# Patient Record
Sex: Male | Born: 1965 | Race: White | Marital: Married | Smoking: Never smoker
Health system: Southern US, Community
[De-identification: ages and names within clinical notes are randomized; demographics above are authoritative.]

## PROBLEM LIST (undated history)

## (undated) DIAGNOSIS — E785 Hyperlipidemia, unspecified: Secondary | ICD-10-CM

## (undated) HISTORY — PX: OTHER SURGICAL HISTORY: SHX169

## (undated) HISTORY — DX: Hyperlipidemia, unspecified: E78.5

---

## 2014-10-30 ENCOUNTER — Ambulatory Visit (INDEPENDENT_AMBULATORY_CARE_PROVIDER_SITE_OTHER): Payer: BC Managed Care – PPO | Admitting: Cardiology

## 2014-10-30 ENCOUNTER — Ambulatory Visit (INDEPENDENT_AMBULATORY_CARE_PROVIDER_SITE_OTHER)
Admission: RE | Admit: 2014-10-30 | Discharge: 2014-10-30 | Disposition: A | Discharge status: Home / Self Care | Payer: Self-pay | Source: Ambulatory Visit | Attending: Cardiology | Admitting: Cardiology

## 2014-10-30 ENCOUNTER — Encounter: Payer: Self-pay | Admitting: Cardiology

## 2014-10-30 VITALS — BP 110/72 | HR 75 | Ht 75.0 in | Wt 234.4 lb

## 2014-10-30 DIAGNOSIS — Z8249 Family history of ischemic heart disease and other diseases of the circulatory system: Secondary | ICD-10-CM

## 2014-10-30 DIAGNOSIS — E785 Hyperlipidemia, unspecified: Secondary | ICD-10-CM

## 2014-10-30 NOTE — Patient Instructions (Signed)
**Note De-Identified Neelah Mannings Obfuscation** Your physician recommends that you continue on your current medications as directed. Please refer to the Current Medication list given to you today.  Your physician has ordered a Calcium Score on you, we will arrange.  Your physician recommends that you schedule a follow-up appointment in: as needed

## 2014-10-30 NOTE — Progress Notes (Signed)
[address] [place], [place]  [address] Phone: [phone] Fax:  [phone]) [phone]  Date:  10/30/2014   ID:  Vernon AndreaMark Hyun, DOB [birth year], MRN [id number]  PCP:  No primary care provider on file.   History of Present Illness: Vernon Miller is a 49 y.o. male here for evaluation secondary to family history of coronary artery disease. 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 His father died of myocardial infarction (5650 first MI) at age 47, his brother has coronary artery disease status post PCI at age 47, his other brother was also diagnosed with coronary artery disease. He has never smoked. He does not drink to excess. He is currently on atorvastatin 20 mg. His last LDL was 101. ALT 34.  His mother was also recently diagnosed with cardiomyopathy, ejection fraction of 30%.  His family, wife is concerned about his own cardiac status. He himself is having no chest pain, no shortness of breath, syncope, fevers, chills, orthopnea.     Wt Readings from Last 3 Encounters:  10/30/14 234 lb 6.4 oz (106.323 kg)     Past Medical History  Diagnosis Date  . Hyperlipidemia     Past Surgical History  Procedure Laterality Date  . None      Current Outpatient Prescriptions  Medication Sig Dispense Refill  . atorvastatin (LIPITOR) 20 MG tablet Take 20 mg by mouth daily.     No current facility-administered medications for this visit.    Allergies:   No Known Allergies  Social History:  The patient  reports that he has never smoked. He does not have any smokeless tobacco history on file. He reports that he drinks alcohol. He reports that he does not use illicit drugs.   Family History  Problem Relation Age of Onset  . Heart Problems Mother   . Heart attack Father     ROS:  Please see the history of present illness.   No syncope, no bleeding, no orthopnea, no PND. No rashes.   All other systems reviewed and negative.   PHYSICAL EXAM: VS:  BP 110/72 mmHg  Pulse 75   Ht 6\' 3"  (1.905 m)  Wt 234 lb 6.4 oz (106.323 kg)  BMI 29.30 kg/m2 Well nourished, well developed, in no acute distress HEENT: normal, /AT, EOMI Neck: no JVD, normal carotid upstroke, no bruit Cardiac:  normal S1, S2; RRR; no murmur Lungs:  clear to auscultation bilaterally, no wheezing, rhonchi or rales Abd: soft, nontender, no hepatomegaly, no bruits Ext: no edema, 2+ distal pulses Skin: warm and dry GU: deferred Neuro: no focal abnormalities noted, AAO x 3  EKG:  10/30/14-sinus rhythm, 75, no other abnormalities except for subtle J-point elevation diffusely.  Labs: LDL was 101. ALT 34      ASSESSMENT AND PLAN:  1. Strong family history of coronary artery disease/hyperlipidemia - multiple family members with coronary artery disease. Mother with cardiomyopathy currently EF of 30%. We discussed potential modalities for assessment of his cardiac status and have chosen to proceed with coronary calcium score. For now, continue with atorvastatin 20 mg. If calcium score comes back abnormal, we will increase his atorvastatin to 40 mg. We will also likely proceed if abnormal with nuclear stress test to see if there are any flow limitations underlying. We discussed the rationale behind these testing. Invasive testing with coronary angiography would not be warranted at this time. He understands. 2. We will follow-up as needed.  Signed, Donato SchultzMark Pallie Swigert, MD [facility]  10/30/2014 3:40 PM

## 2014-11-04 ENCOUNTER — Telehealth: Payer: Self-pay | Admitting: Cardiology

## 2014-11-04 NOTE — Telephone Encounter (Signed)
Left message to call back  

## 2014-11-04 NOTE — Telephone Encounter (Signed)
Follow up     Returning a nurses call to get lab results.  Please call back and ok [facility]

## 2014-11-04 NOTE — Telephone Encounter (Signed)
Pt returned call. Informed pt of results. Pt verbalized understanding.

## 2014-11-04 NOTE — Telephone Encounter (Signed)
New Message          Pt calling to find out results of calcium scoring. Please call back and advise.

## 2015-10-28 IMAGING — CT CT HEART SCORING
1 of 3 series · 10 of 20 positions shown, 13 images · non-contrast
Comparison: No priors.

CLINICAL DATA: Risk stratification

EXAM:
Coronary Calcium Score
TECHNIQUE: The patient was scanned on a Siemens Sensation 16 slice scanner.
Axial non-contrast 3mm slices were carried out through the heart.
The data set was analyzed on a dedicated work station and scored
using the Agatson method.

[Series 6: st thins for reformat · axial · 0.76mm/px · z∈[-280,-164]mm · 10 of 142 slices shown, 13 images]
[im 13/142  vessel]
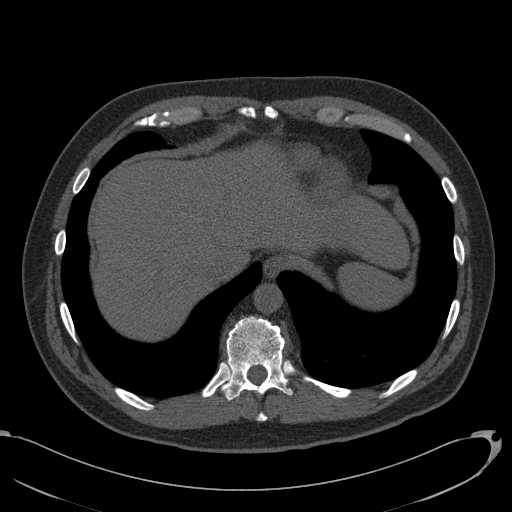
[im 13/142  lung]
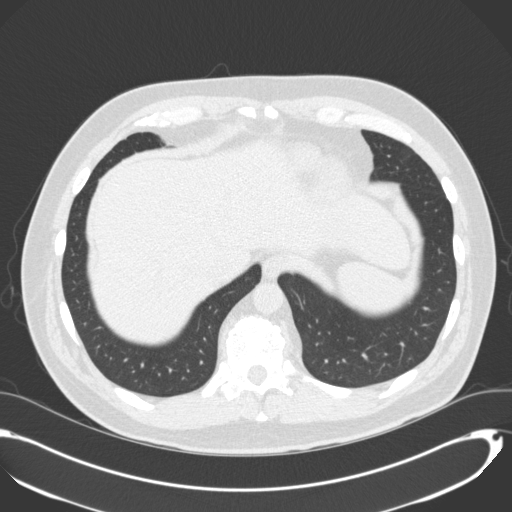
[im 26/142  vessel]
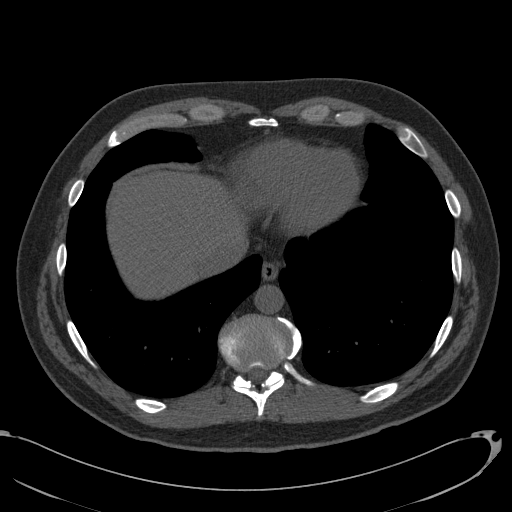
[im 39/142  vessel]
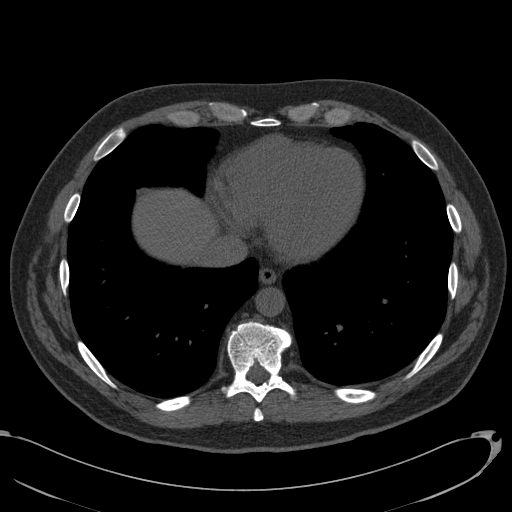
[im 52/142  vessel]
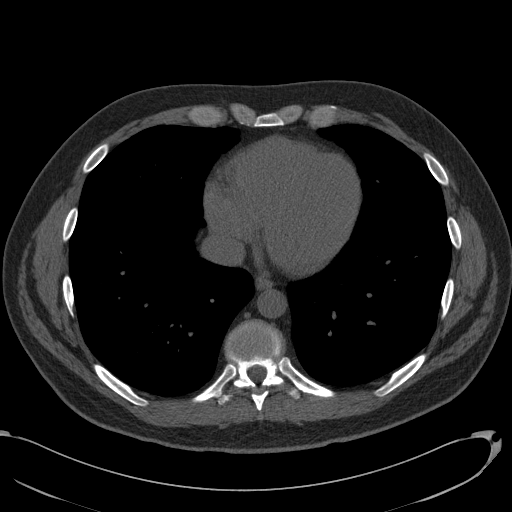
[im 65/142  vessel]
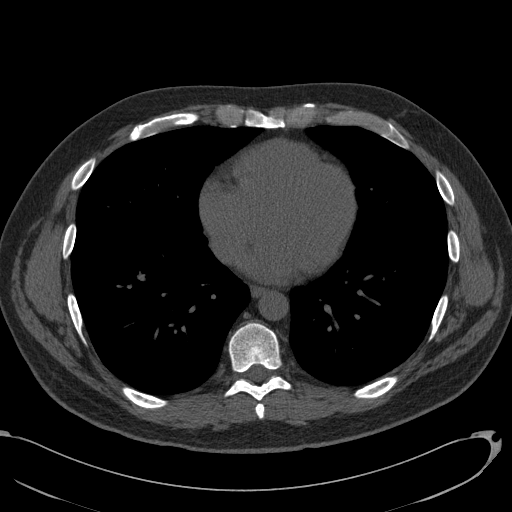
[im 65/142  lung]
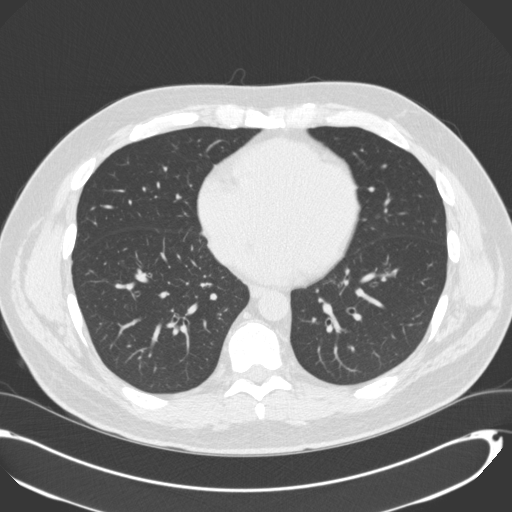
[im 77/142  vessel]
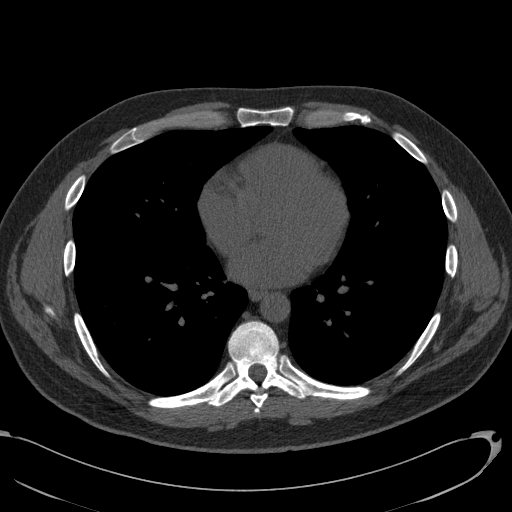
[im 90/142  vessel]
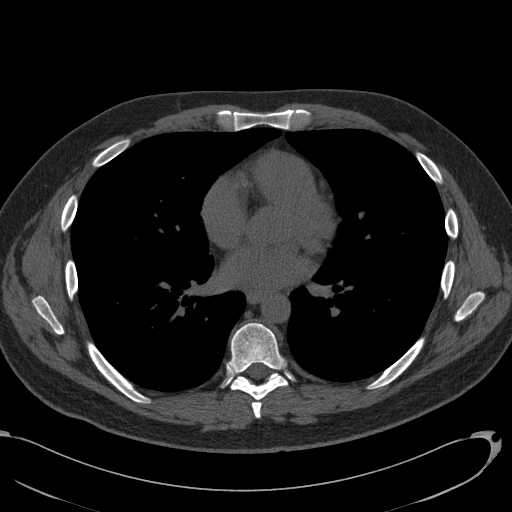
[im 103/142  vessel]
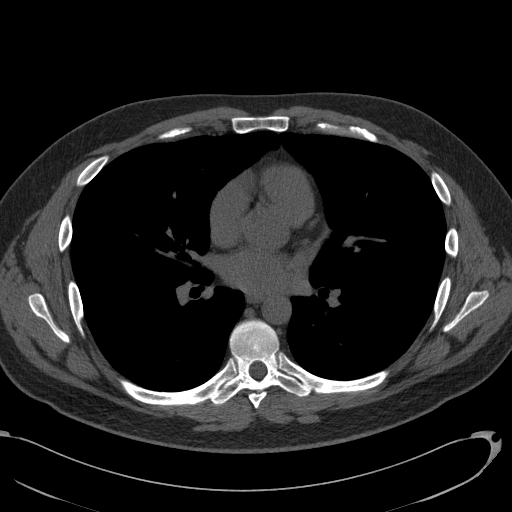
[im 116/142  vessel]
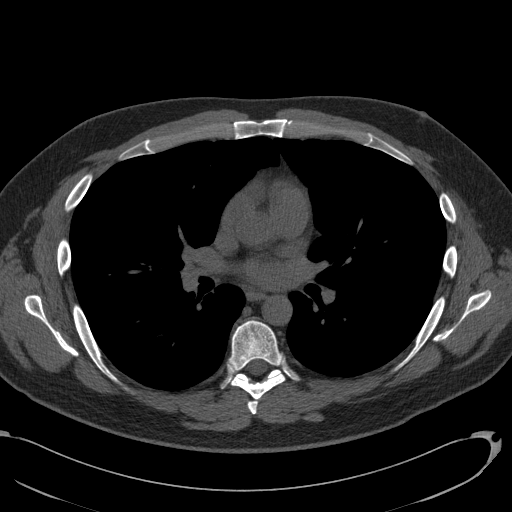
[im 116/142  lung]
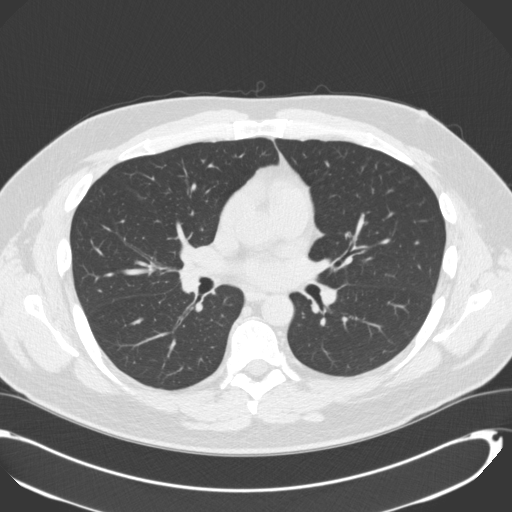
[im 129/142  vessel]
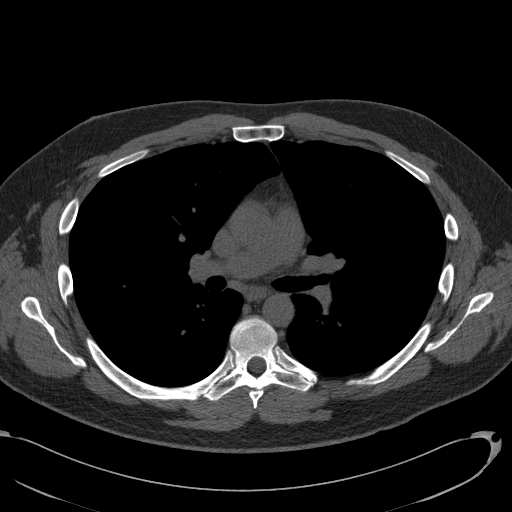

[10 of 20 positions shown; findings below may reference images not displayed]

FINDINGS: Non-cardiac: 3 mm lung nodule See separate report from [REDACTED].

Ascending Aorta:  3.3 cm

Pericardium: Normal

Coronary arteries:  No calcium seen
IMPRESSION: Coronary calcium score of 0.

Baruhuke Devote

EXAM:
OVER-READ INTERPRETATION  CT CHEST

The following report is an over-read performed by radiologist Dr.
over-read does not include interpretation of cardiac or coronary
anatomy or pathology. The coronary calcium score interpretation by
the cardiologist is attached.
FINDINGS: 3 mm nodule in the right middle lobe (image 15 of series 4). No
other larger more suspicious appearing pulmonary nodules or masses
are otherwise noted in the visualized portions of the thorax. Within
the visualized thorax there is no pneumothorax, acute consolidative
airspace disease, pleural effusion or lymphadenopathy. Visualized
portions of the upper abdomen are unremarkable. There are no
aggressive appearing lytic or blastic lesions noted in the
visualized portions of the skeleton.
IMPRESSION: 1. 3 mm nodule in the right middle lobe a statistically very likely
to be benign in this patient, but is nonspecific. If the patient is
at high risk for bronchogenic carcinoma, follow-up chest CT at 1
year is recommended. If the patient is at low risk, no follow-up is
needed. This recommendation follows the consensus statement:
Guidelines for Management of Small Pulmonary Nodules Detected on CT
Scans: A Statement from the [HOSPITAL] as published in

## 2016-06-10 DIAGNOSIS — E7800 Pure hypercholesterolemia, unspecified: Secondary | ICD-10-CM | POA: Diagnosis not present

## 2016-06-10 DIAGNOSIS — Z23 Encounter for immunization: Secondary | ICD-10-CM | POA: Diagnosis not present

## 2016-06-10 DIAGNOSIS — N529 Male erectile dysfunction, unspecified: Secondary | ICD-10-CM | POA: Diagnosis not present

## 2016-06-10 DIAGNOSIS — F322 Major depressive disorder, single episode, severe without psychotic features: Secondary | ICD-10-CM | POA: Diagnosis not present

## 2016-06-10 DIAGNOSIS — Z79899 Other long term (current) drug therapy: Secondary | ICD-10-CM | POA: Diagnosis not present

## 2016-10-19 DIAGNOSIS — K625 Hemorrhage of anus and rectum: Secondary | ICD-10-CM | POA: Diagnosis not present

## 2016-10-19 DIAGNOSIS — E7800 Pure hypercholesterolemia, unspecified: Secondary | ICD-10-CM | POA: Diagnosis not present

## 2016-12-22 DIAGNOSIS — N529 Male erectile dysfunction, unspecified: Secondary | ICD-10-CM | POA: Diagnosis not present

## 2016-12-22 DIAGNOSIS — F322 Major depressive disorder, single episode, severe without psychotic features: Secondary | ICD-10-CM | POA: Diagnosis not present

## 2016-12-22 DIAGNOSIS — E7800 Pure hypercholesterolemia, unspecified: Secondary | ICD-10-CM | POA: Diagnosis not present

## 2016-12-22 DIAGNOSIS — Z79899 Other long term (current) drug therapy: Secondary | ICD-10-CM | POA: Diagnosis not present

## 2017-06-24 DIAGNOSIS — F322 Major depressive disorder, single episode, severe without psychotic features: Secondary | ICD-10-CM | POA: Diagnosis not present

## 2017-06-24 DIAGNOSIS — Z79899 Other long term (current) drug therapy: Secondary | ICD-10-CM | POA: Diagnosis not present

## 2017-06-24 DIAGNOSIS — Z23 Encounter for immunization: Secondary | ICD-10-CM | POA: Diagnosis not present

## 2017-06-24 DIAGNOSIS — E7800 Pure hypercholesterolemia, unspecified: Secondary | ICD-10-CM | POA: Diagnosis not present

## 2017-06-24 DIAGNOSIS — N529 Male erectile dysfunction, unspecified: Secondary | ICD-10-CM | POA: Diagnosis not present

## 2018-01-02 DIAGNOSIS — N529 Male erectile dysfunction, unspecified: Secondary | ICD-10-CM | POA: Diagnosis not present

## 2018-01-02 DIAGNOSIS — R1032 Left lower quadrant pain: Secondary | ICD-10-CM | POA: Diagnosis not present

## 2018-01-02 DIAGNOSIS — E7800 Pure hypercholesterolemia, unspecified: Secondary | ICD-10-CM | POA: Diagnosis not present

## 2018-01-02 DIAGNOSIS — Z79899 Other long term (current) drug therapy: Secondary | ICD-10-CM | POA: Diagnosis not present

## 2018-01-02 DIAGNOSIS — F322 Major depressive disorder, single episode, severe without psychotic features: Secondary | ICD-10-CM | POA: Diagnosis not present

## 2018-01-04 ENCOUNTER — Other Ambulatory Visit: Payer: Self-pay | Admitting: Family Medicine

## 2018-01-04 DIAGNOSIS — R1032 Left lower quadrant pain: Secondary | ICD-10-CM

## 2018-01-09 ENCOUNTER — Ambulatory Visit
Admission: RE | Admit: 2018-01-09 | Discharge: 2018-01-09 | Disposition: A | Discharge status: Home / Self Care | Payer: BLUE CROSS/BLUE SHIELD | Source: Ambulatory Visit | Attending: Family Medicine | Admitting: Family Medicine

## 2018-01-09 DIAGNOSIS — R1032 Left lower quadrant pain: Secondary | ICD-10-CM | POA: Diagnosis not present

## 2018-01-09 MED ORDER — IOPAMIDOL (ISOVUE-300) INJECTION 61%
125.0000 mL | Freq: Once | INTRAVENOUS | Status: AC | PRN
  Administered 2018-01-09: 125 mL via INTRAVENOUS

## 2018-07-04 DIAGNOSIS — N529 Male erectile dysfunction, unspecified: Secondary | ICD-10-CM | POA: Diagnosis not present

## 2018-07-04 DIAGNOSIS — F5221 Male erectile disorder: Secondary | ICD-10-CM | POA: Diagnosis not present

## 2018-07-04 DIAGNOSIS — Z23 Encounter for immunization: Secondary | ICD-10-CM | POA: Diagnosis not present

## 2018-07-04 DIAGNOSIS — Z0000 Encounter for general adult medical examination without abnormal findings: Secondary | ICD-10-CM | POA: Diagnosis not present

## 2018-07-04 DIAGNOSIS — E7800 Pure hypercholesterolemia, unspecified: Secondary | ICD-10-CM | POA: Diagnosis not present

## 2018-07-04 DIAGNOSIS — Z125 Encounter for screening for malignant neoplasm of prostate: Secondary | ICD-10-CM | POA: Diagnosis not present

## 2018-07-04 DIAGNOSIS — Z79899 Other long term (current) drug therapy: Secondary | ICD-10-CM | POA: Diagnosis not present

## 2019-01-12 DIAGNOSIS — F322 Major depressive disorder, single episode, severe without psychotic features: Secondary | ICD-10-CM | POA: Diagnosis not present

## 2019-01-12 DIAGNOSIS — E7800 Pure hypercholesterolemia, unspecified: Secondary | ICD-10-CM | POA: Diagnosis not present

## 2019-01-12 DIAGNOSIS — N529 Male erectile dysfunction, unspecified: Secondary | ICD-10-CM | POA: Diagnosis not present

## 2019-03-14 DIAGNOSIS — D123 Benign neoplasm of transverse colon: Secondary | ICD-10-CM | POA: Diagnosis not present

## 2019-03-14 DIAGNOSIS — Z1211 Encounter for screening for malignant neoplasm of colon: Secondary | ICD-10-CM | POA: Diagnosis not present

## 2019-07-12 DIAGNOSIS — Z0000 Encounter for general adult medical examination without abnormal findings: Secondary | ICD-10-CM | POA: Diagnosis not present

## 2019-07-12 DIAGNOSIS — Z23 Encounter for immunization: Secondary | ICD-10-CM | POA: Diagnosis not present

## 2019-07-12 DIAGNOSIS — E7800 Pure hypercholesterolemia, unspecified: Secondary | ICD-10-CM | POA: Diagnosis not present

## 2019-07-12 DIAGNOSIS — Z79899 Other long term (current) drug therapy: Secondary | ICD-10-CM | POA: Diagnosis not present

## 2019-11-11 DIAGNOSIS — Z23 Encounter for immunization: Secondary | ICD-10-CM | POA: Diagnosis not present

## 2019-12-02 DIAGNOSIS — Z23 Encounter for immunization: Secondary | ICD-10-CM | POA: Diagnosis not present

## 2020-01-10 DIAGNOSIS — N529 Male erectile dysfunction, unspecified: Secondary | ICD-10-CM | POA: Diagnosis not present

## 2020-01-10 DIAGNOSIS — Z79899 Other long term (current) drug therapy: Secondary | ICD-10-CM | POA: Diagnosis not present

## 2020-01-10 DIAGNOSIS — E7800 Pure hypercholesterolemia, unspecified: Secondary | ICD-10-CM | POA: Diagnosis not present

## 2020-01-10 DIAGNOSIS — F322 Major depressive disorder, single episode, severe without psychotic features: Secondary | ICD-10-CM | POA: Diagnosis not present

## 2020-07-21 DIAGNOSIS — R21 Rash and other nonspecific skin eruption: Secondary | ICD-10-CM | POA: Diagnosis not present

## 2020-08-05 DIAGNOSIS — Z23 Encounter for immunization: Secondary | ICD-10-CM | POA: Diagnosis not present

## 2020-08-05 DIAGNOSIS — E7800 Pure hypercholesterolemia, unspecified: Secondary | ICD-10-CM | POA: Diagnosis not present

## 2020-08-05 DIAGNOSIS — Z125 Encounter for screening for malignant neoplasm of prostate: Secondary | ICD-10-CM | POA: Diagnosis not present

## 2020-08-05 DIAGNOSIS — Z79899 Other long term (current) drug therapy: Secondary | ICD-10-CM | POA: Diagnosis not present

## 2020-08-05 DIAGNOSIS — Z0000 Encounter for general adult medical examination without abnormal findings: Secondary | ICD-10-CM | POA: Diagnosis not present

## 2020-10-07 DIAGNOSIS — Z23 Encounter for immunization: Secondary | ICD-10-CM | POA: Diagnosis not present

## 2021-02-03 DIAGNOSIS — F3342 Major depressive disorder, recurrent, in full remission: Secondary | ICD-10-CM | POA: Diagnosis not present

## 2021-02-03 DIAGNOSIS — Z79899 Other long term (current) drug therapy: Secondary | ICD-10-CM | POA: Diagnosis not present

## 2021-02-03 DIAGNOSIS — E7800 Pure hypercholesterolemia, unspecified: Secondary | ICD-10-CM | POA: Diagnosis not present

## 2021-02-03 DIAGNOSIS — N529 Male erectile dysfunction, unspecified: Secondary | ICD-10-CM | POA: Diagnosis not present

## 2021-08-10 DIAGNOSIS — Z0000 Encounter for general adult medical examination without abnormal findings: Secondary | ICD-10-CM | POA: Diagnosis not present

## 2021-08-14 DIAGNOSIS — Z79899 Other long term (current) drug therapy: Secondary | ICD-10-CM | POA: Diagnosis not present

## 2021-08-14 DIAGNOSIS — E7800 Pure hypercholesterolemia, unspecified: Secondary | ICD-10-CM | POA: Diagnosis not present

## 2021-11-30 DIAGNOSIS — H16223 Keratoconjunctivitis sicca, not specified as Sjogren's, bilateral: Secondary | ICD-10-CM | POA: Diagnosis not present

## 2021-11-30 DIAGNOSIS — H0288B Meibomian gland dysfunction left eye, upper and lower eyelids: Secondary | ICD-10-CM | POA: Diagnosis not present

## 2021-11-30 DIAGNOSIS — H0288A Meibomian gland dysfunction right eye, upper and lower eyelids: Secondary | ICD-10-CM | POA: Diagnosis not present

## 2021-12-21 DIAGNOSIS — S60212A Contusion of left wrist, initial encounter: Secondary | ICD-10-CM | POA: Diagnosis not present

## 2022-01-05 DIAGNOSIS — S60212D Contusion of left wrist, subsequent encounter: Secondary | ICD-10-CM | POA: Diagnosis not present

## 2022-02-08 DIAGNOSIS — S60212D Contusion of left wrist, subsequent encounter: Secondary | ICD-10-CM | POA: Diagnosis not present

## 2022-02-11 DIAGNOSIS — F3342 Major depressive disorder, recurrent, in full remission: Secondary | ICD-10-CM | POA: Diagnosis not present

## 2022-02-11 DIAGNOSIS — E7800 Pure hypercholesterolemia, unspecified: Secondary | ICD-10-CM | POA: Diagnosis not present

## 2022-02-11 DIAGNOSIS — Z79899 Other long term (current) drug therapy: Secondary | ICD-10-CM | POA: Diagnosis not present

## 2022-02-11 DIAGNOSIS — N529 Male erectile dysfunction, unspecified: Secondary | ICD-10-CM | POA: Diagnosis not present

## 2022-02-18 DIAGNOSIS — M25532 Pain in left wrist: Secondary | ICD-10-CM | POA: Diagnosis not present

## 2022-03-11 DIAGNOSIS — M25532 Pain in left wrist: Secondary | ICD-10-CM | POA: Diagnosis not present

## 2022-04-08 DIAGNOSIS — M25532 Pain in left wrist: Secondary | ICD-10-CM | POA: Diagnosis not present

## 2022-11-19 DIAGNOSIS — Z0000 Encounter for general adult medical examination without abnormal findings: Secondary | ICD-10-CM | POA: Diagnosis not present

## 2022-11-22 DIAGNOSIS — Z79899 Other long term (current) drug therapy: Secondary | ICD-10-CM | POA: Diagnosis not present

## 2022-11-22 DIAGNOSIS — E7800 Pure hypercholesterolemia, unspecified: Secondary | ICD-10-CM | POA: Diagnosis not present

## 2022-11-22 DIAGNOSIS — Z125 Encounter for screening for malignant neoplasm of prostate: Secondary | ICD-10-CM | POA: Diagnosis not present

## 2023-05-26 DIAGNOSIS — F3342 Major depressive disorder, recurrent, in full remission: Secondary | ICD-10-CM | POA: Diagnosis not present

## 2023-05-26 DIAGNOSIS — N529 Male erectile dysfunction, unspecified: Secondary | ICD-10-CM | POA: Diagnosis not present

## 2023-05-26 DIAGNOSIS — Z23 Encounter for immunization: Secondary | ICD-10-CM | POA: Diagnosis not present

## 2023-05-26 DIAGNOSIS — E7800 Pure hypercholesterolemia, unspecified: Secondary | ICD-10-CM | POA: Diagnosis not present

## 2023-05-26 DIAGNOSIS — Z79899 Other long term (current) drug therapy: Secondary | ICD-10-CM | POA: Diagnosis not present
